# Patient Record
Sex: Male | Born: 1935 | Race: White | Hispanic: No | Smoking: Never smoker
Health system: Southern US, Community
[De-identification: ages and names within clinical notes are randomized; demographics above are authoritative.]

## PROBLEM LIST (undated history)

## (undated) DIAGNOSIS — R062 Wheezing: Secondary | ICD-10-CM

## (undated) DIAGNOSIS — G20A1 Parkinson's disease without dyskinesia, without mention of fluctuations: Secondary | ICD-10-CM

## (undated) DIAGNOSIS — G20 Parkinson's disease: Secondary | ICD-10-CM

## (undated) DIAGNOSIS — R339 Retention of urine, unspecified: Secondary | ICD-10-CM

## (undated) DIAGNOSIS — R279 Unspecified lack of coordination: Secondary | ICD-10-CM

## (undated) DIAGNOSIS — I10 Essential (primary) hypertension: Secondary | ICD-10-CM

## (undated) DIAGNOSIS — R1310 Dysphagia, unspecified: Secondary | ICD-10-CM

## (undated) DIAGNOSIS — N481 Balanitis: Secondary | ICD-10-CM

## (undated) DIAGNOSIS — F0390 Unspecified dementia without behavioral disturbance: Secondary | ICD-10-CM

## (undated) DIAGNOSIS — D631 Anemia in chronic kidney disease: Secondary | ICD-10-CM

## (undated) DIAGNOSIS — R269 Unspecified abnormalities of gait and mobility: Secondary | ICD-10-CM

## (undated) DIAGNOSIS — F329 Major depressive disorder, single episode, unspecified: Secondary | ICD-10-CM

## (undated) DIAGNOSIS — K5792 Diverticulitis of intestine, part unspecified, without perforation or abscess without bleeding: Secondary | ICD-10-CM

## (undated) DIAGNOSIS — N179 Acute kidney failure, unspecified: Secondary | ICD-10-CM

## (undated) DIAGNOSIS — R296 Repeated falls: Secondary | ICD-10-CM

## (undated) DIAGNOSIS — N189 Chronic kidney disease, unspecified: Secondary | ICD-10-CM

## (undated) DIAGNOSIS — M6281 Muscle weakness (generalized): Secondary | ICD-10-CM

## (undated) DIAGNOSIS — G4700 Insomnia, unspecified: Secondary | ICD-10-CM

## (undated) DIAGNOSIS — N390 Urinary tract infection, site not specified: Secondary | ICD-10-CM

## (undated) DIAGNOSIS — K219 Gastro-esophageal reflux disease without esophagitis: Secondary | ICD-10-CM

---

## 2016-05-27 ENCOUNTER — Observation Stay (HOSPITAL_COMMUNITY)
Admission: EM | Admit: 2016-05-27 | Discharge: 2016-05-28 | Disposition: A | Discharge status: Home / Self Care | Payer: Medicare Other | Attending: Internal Medicine | Admitting: Internal Medicine

## 2016-05-27 ENCOUNTER — Other Ambulatory Visit: Payer: Self-pay

## 2016-05-27 ENCOUNTER — Emergency Department (HOSPITAL_COMMUNITY): Payer: Medicare Other

## 2016-05-27 ENCOUNTER — Encounter (HOSPITAL_COMMUNITY): Payer: Self-pay | Admitting: Emergency Medicine

## 2016-05-27 DIAGNOSIS — Z79899 Other long term (current) drug therapy: Secondary | ICD-10-CM | POA: Insufficient documentation

## 2016-05-27 DIAGNOSIS — N390 Urinary tract infection, site not specified: Secondary | ICD-10-CM

## 2016-05-27 DIAGNOSIS — F0391 Unspecified dementia with behavioral disturbance: Secondary | ICD-10-CM | POA: Diagnosis not present

## 2016-05-27 DIAGNOSIS — G20 Parkinson's disease: Secondary | ICD-10-CM | POA: Insufficient documentation

## 2016-05-27 DIAGNOSIS — N3000 Acute cystitis without hematuria: Secondary | ICD-10-CM

## 2016-05-27 DIAGNOSIS — F0390 Unspecified dementia without behavioral disturbance: Secondary | ICD-10-CM | POA: Diagnosis present

## 2016-05-27 DIAGNOSIS — G9341 Metabolic encephalopathy: Secondary | ICD-10-CM | POA: Diagnosis present

## 2016-05-27 DIAGNOSIS — R4189 Other symptoms and signs involving cognitive functions and awareness: Secondary | ICD-10-CM

## 2016-05-27 DIAGNOSIS — R4182 Altered mental status, unspecified: Secondary | ICD-10-CM | POA: Diagnosis not present

## 2016-05-27 DIAGNOSIS — L8990 Pressure ulcer of unspecified site, unspecified stage: Secondary | ICD-10-CM | POA: Insufficient documentation

## 2016-05-27 DIAGNOSIS — I10 Essential (primary) hypertension: Secondary | ICD-10-CM | POA: Insufficient documentation

## 2016-05-27 DIAGNOSIS — G20A1 Parkinson's disease without dyskinesia, without mention of fluctuations: Secondary | ICD-10-CM | POA: Diagnosis present

## 2016-05-27 DIAGNOSIS — F03918 Unspecified dementia, unspecified severity, with other behavioral disturbance: Secondary | ICD-10-CM | POA: Diagnosis present

## 2016-05-27 HISTORY — DX: Unspecified dementia, unspecified severity, without behavioral disturbance, psychotic disturbance, mood disturbance, and anxiety: F03.90

## 2016-05-27 HISTORY — DX: Parkinson's disease without dyskinesia, without mention of fluctuations: G20.A1

## 2016-05-27 HISTORY — DX: Wheezing: R06.2

## 2016-05-27 HISTORY — DX: Repeated falls: R29.6

## 2016-05-27 HISTORY — DX: Muscle weakness (generalized): M62.81

## 2016-05-27 HISTORY — DX: Unspecified abnormalities of gait and mobility: R26.9

## 2016-05-27 HISTORY — DX: Acute kidney failure, unspecified: N17.9

## 2016-05-27 HISTORY — DX: Insomnia, unspecified: G47.00

## 2016-05-27 HISTORY — DX: Anemia in chronic kidney disease: D63.1

## 2016-05-27 HISTORY — DX: Balanitis: N48.1

## 2016-05-27 HISTORY — DX: Urinary tract infection, site not specified: N39.0

## 2016-05-27 HISTORY — DX: Essential (primary) hypertension: I10

## 2016-05-27 HISTORY — DX: Diverticulitis of intestine, part unspecified, without perforation or abscess without bleeding: K57.92

## 2016-05-27 HISTORY — DX: Retention of urine, unspecified: R33.9

## 2016-05-27 HISTORY — DX: Parkinson's disease: G20

## 2016-05-27 HISTORY — DX: Chronic kidney disease, unspecified: N18.9

## 2016-05-27 HISTORY — DX: Dysphagia, unspecified: R13.10

## 2016-05-27 HISTORY — DX: Unspecified lack of coordination: R27.9

## 2016-05-27 HISTORY — DX: Major depressive disorder, single episode, unspecified: F32.9

## 2016-05-27 HISTORY — DX: Gastro-esophageal reflux disease without esophagitis: K21.9

## 2016-05-27 LAB — URINE MICROSCOPIC-ADD ON: RBC / HPF: NONE SEEN RBC/hpf (ref 0–5)

## 2016-05-27 LAB — URINALYSIS, ROUTINE W REFLEX MICROSCOPIC
Bilirubin Urine: NEGATIVE
Glucose, UA: NEGATIVE mg/dL
Hgb urine dipstick: NEGATIVE
NITRITE: NEGATIVE
PH: 6 (ref 5.0–8.0)
PROTEIN: NEGATIVE mg/dL
Specific Gravity, Urine: 1.02 (ref 1.005–1.030)

## 2016-05-27 LAB — COMPREHENSIVE METABOLIC PANEL
ALBUMIN: 3.5 g/dL (ref 3.5–5.0)
ALK PHOS: 51 U/L (ref 38–126)
ALT: 8 U/L — AB (ref 17–63)
AST: 20 U/L (ref 15–41)
Anion gap: 5 (ref 5–15)
BILIRUBIN TOTAL: 0.5 mg/dL (ref 0.3–1.2)
BUN: 28 mg/dL — AB (ref 6–20)
CALCIUM: 8.7 mg/dL — AB (ref 8.9–10.3)
CO2: 27 mmol/L (ref 22–32)
Chloride: 107 mmol/L (ref 101–111)
Creatinine, Ser: 0.93 mg/dL (ref 0.61–1.24)
GFR calc Af Amer: >60 mL/min (ref >60)
GFR calc non Af Amer: >60 mL/min (ref >60)
GLUCOSE: 92 mg/dL (ref 65–99)
POTASSIUM: 3.8 mmol/L (ref 3.5–5.1)
Sodium: 139 mmol/L (ref 135–145)
TOTAL PROTEIN: 6.5 g/dL (ref 6.5–8.1)

## 2016-05-27 LAB — CBC WITH DIFFERENTIAL/PLATELET
BASOS ABS: 0 10*3/uL (ref 0.0–0.1)
BASOS PCT: 0 %
Eosinophils Absolute: 0.2 10*3/uL (ref 0.0–0.7)
Eosinophils Relative: 2 %
HEMATOCRIT: 35 % — AB (ref 39.0–52.0)
HEMOGLOBIN: 11.6 g/dL — AB (ref 13.0–17.0)
Lymphocytes Relative: 23 %
Lymphs Abs: 1.9 10*3/uL (ref 0.7–4.0)
MCH: 30.6 pg (ref 26.0–34.0)
MCHC: 33.1 g/dL (ref 30.0–36.0)
MCV: 92.3 fL (ref 78.0–100.0)
Monocytes Absolute: 0.8 10*3/uL (ref 0.1–1.0)
Monocytes Relative: 10 %
NEUTROS ABS: 5.6 10*3/uL (ref 1.7–7.7)
NEUTROS PCT: 65 %
Platelets: 160 10*3/uL (ref 150–400)
RBC: 3.79 MIL/uL — AB (ref 4.22–5.81)
RDW: 13.3 % (ref 11.5–15.5)
WBC: 8.5 10*3/uL (ref 4.0–10.5)

## 2016-05-27 LAB — I-STAT CG4 LACTIC ACID, ED: Lactic Acid, Venous: 0.73 mmol/L (ref 0.5–2.0)

## 2016-05-27 LAB — TROPONIN I: Troponin I: <0.03 ng/mL (ref <0.031)

## 2016-05-27 MED ORDER — POLYETHYLENE GLYCOL 3350 17 G PO PACK
17.0000 g | PACK | Freq: Every day | ORAL | Status: DC
  Administered 2016-05-28: 17 g via ORAL
  Filled 2016-05-27: qty 1

## 2016-05-27 MED ORDER — IPRATROPIUM-ALBUTEROL 0.5-2.5 (3) MG/3ML IN SOLN: 3.0000 mL | Freq: Four times a day (QID) | RESPIRATORY_TRACT | Status: DC | PRN

## 2016-05-27 MED ORDER — VITAMIN D 1000 UNITS PO TABS
1000.0000 [IU] | ORAL_TABLET | Freq: Every day | ORAL | Status: DC
  Filled 2016-05-27: qty 1

## 2016-05-27 MED ORDER — HALOPERIDOL LACTATE 5 MG/ML IJ SOLN: 5.0000 mg | Freq: Four times a day (QID) | INTRAMUSCULAR | Status: DC | PRN

## 2016-05-27 MED ORDER — MOMETASONE FURO-FORMOTEROL FUM 200-5 MCG/ACT IN AERO
2.0000 | INHALATION_SPRAY | Freq: Two times a day (BID) | RESPIRATORY_TRACT | Status: DC
  Filled 2016-05-27: qty 8.8

## 2016-05-27 MED ORDER — CEFTRIAXONE SODIUM 1 G IJ SOLR
1.0000 g | Freq: Once | INTRAMUSCULAR | Status: AC
  Administered 2016-05-27: 1 g via INTRAVENOUS
  Filled 2016-05-27: qty 10

## 2016-05-27 MED ORDER — SERTRALINE HCL 50 MG PO TABS
100.0000 mg | ORAL_TABLET | Freq: Every day | ORAL | Status: DC
  Administered 2016-05-28: 100 mg via ORAL
  Filled 2016-05-27: qty 2

## 2016-05-27 MED ORDER — ASPIRIN EC 81 MG PO TBEC
81.0000 mg | DELAYED_RELEASE_TABLET | Freq: Every day | ORAL | Status: DC
  Administered 2016-05-27 – 2016-05-28 (×2): 81 mg via ORAL
  Filled 2016-05-27 (×2): qty 1

## 2016-05-27 MED ORDER — HALOPERIDOL LACTATE 5 MG/ML IJ SOLN
INTRAMUSCULAR | Status: AC
  Filled 2016-05-27: qty 1

## 2016-05-27 MED ORDER — ONDANSETRON HCL 4 MG/2ML IJ SOLN: 4.0000 mg | Freq: Four times a day (QID) | INTRAMUSCULAR | Status: DC | PRN

## 2016-05-27 MED ORDER — ACETAMINOPHEN 650 MG RE SUPP: 650.0000 mg | Freq: Four times a day (QID) | RECTAL | Status: DC | PRN

## 2016-05-27 MED ORDER — CARBIDOPA-LEVODOPA-ENTACAPONE 50-200-200 MG PO TABS
1.0000 | ORAL_TABLET | Freq: Three times a day (TID) | ORAL | Status: DC
  Filled 2016-05-27: qty 1

## 2016-05-27 MED ORDER — LOPERAMIDE HCL 2 MG PO CAPS: 2.0000 mg | ORAL_CAPSULE | ORAL | Status: DC | PRN

## 2016-05-27 MED ORDER — HALOPERIDOL LACTATE 2 MG/ML PO CONC
1.0000 mg | ORAL | Status: DC | PRN
  Filled 2016-05-27: qty 0.5

## 2016-05-27 MED ORDER — POLYETHYLENE GLYCOL 3350 17 G PO PACK: 17.0000 g | PACK | Freq: Every day | ORAL | Status: DC | PRN

## 2016-05-27 MED ORDER — MIRTAZAPINE 15 MG PO TABS
15.0000 mg | ORAL_TABLET | Freq: Every day | ORAL | Status: DC
  Administered 2016-05-27: 15 mg via ORAL
  Filled 2016-05-27: qty 1

## 2016-05-27 MED ORDER — ALBUTEROL SULFATE (2.5 MG/3ML) 0.083% IN NEBU: 2.5000 mg | INHALATION_SOLUTION | RESPIRATORY_TRACT | Status: DC | PRN

## 2016-05-27 MED ORDER — HALOPERIDOL 1 MG PO TABS: 1.0000 mg | ORAL_TABLET | Freq: Once | ORAL | Status: DC

## 2016-05-27 MED ORDER — SODIUM CHLORIDE 0.9 % IV SOLN
INTRAVENOUS | Status: AC
  Administered 2016-05-27: 22:00:00 via INTRAVENOUS

## 2016-05-27 MED ORDER — ATENOLOL 25 MG PO TABS
12.5000 mg | ORAL_TABLET | Freq: Every day | ORAL | Status: DC
  Administered 2016-05-28: 12.5 mg via ORAL
  Filled 2016-05-27: qty 1

## 2016-05-27 MED ORDER — HALOPERIDOL LACTATE 5 MG/ML IJ SOLN
2.5000 mg | Freq: Once | INTRAMUSCULAR | Status: AC
  Administered 2016-05-27: 2.5 mg via INTRAVENOUS

## 2016-05-27 MED ORDER — ONDANSETRON HCL 4 MG PO TABS: 4.0000 mg | ORAL_TABLET | Freq: Four times a day (QID) | ORAL | Status: DC | PRN

## 2016-05-27 MED ORDER — FERROUS SULFATE 325 (65 FE) MG PO TABS
325.0000 mg | ORAL_TABLET | Freq: Every day | ORAL | Status: DC
  Administered 2016-05-28: 325 mg via ORAL
  Filled 2016-05-27: qty 1

## 2016-05-27 MED ORDER — SODIUM CHLORIDE 0.9% FLUSH
3.0000 mL | Freq: Two times a day (BID) | INTRAVENOUS | Status: DC
  Administered 2016-05-27: 3 mL via INTRAVENOUS

## 2016-05-27 MED ORDER — DEXTROSE 5 % IV SOLN: 1.0000 g | INTRAVENOUS | Status: DC

## 2016-05-27 MED ORDER — SODIUM CHLORIDE 0.9% FLUSH: 3.0000 mL | INTRAVENOUS | Status: DC | PRN

## 2016-05-27 MED ORDER — VITAMIN B-12 1000 MCG PO TABS
1000.0000 ug | ORAL_TABLET | Freq: Every day | ORAL | Status: DC
  Administered 2016-05-28: 1000 ug via ORAL
  Filled 2016-05-27: qty 1

## 2016-05-27 MED ORDER — DEXTROSE 5 % IV SOLN
1.0000 g | INTRAVENOUS | Status: DC
  Filled 2016-05-27: qty 10

## 2016-05-27 MED ORDER — SENNA 8.6 MG PO TABS
1.0000 | ORAL_TABLET | Freq: Two times a day (BID) | ORAL | Status: DC
  Administered 2016-05-27 – 2016-05-28 (×2): 8.6 mg via ORAL
  Filled 2016-05-27 (×2): qty 1

## 2016-05-27 MED ORDER — ACETAMINOPHEN 325 MG PO TABS: 650.0000 mg | ORAL_TABLET | Freq: Four times a day (QID) | ORAL | Status: DC | PRN

## 2016-05-27 MED ORDER — IPRATROPIUM-ALBUTEROL 18-103 MCG/ACT IN AERO: 1.0000 | INHALATION_SPRAY | Freq: Four times a day (QID) | RESPIRATORY_TRACT | Status: DC | PRN

## 2016-05-27 MED ORDER — TRAZODONE HCL 50 MG PO TABS
150.0000 mg | ORAL_TABLET | Freq: Every day | ORAL | Status: DC
  Administered 2016-05-27: 150 mg via ORAL
  Filled 2016-05-27: qty 3

## 2016-05-27 MED ORDER — MELATONIN 5 MG PO TABS: 10.0000 mg | ORAL_TABLET | Freq: Every day | ORAL | Status: DC

## 2016-05-27 MED ORDER — SODIUM CHLORIDE 0.9 % IV SOLN: 250.0000 mL | INTRAVENOUS | Status: DC | PRN

## 2016-05-27 MED ORDER — PANTOPRAZOLE SODIUM 40 MG PO TBEC
40.0000 mg | DELAYED_RELEASE_TABLET | Freq: Every day | ORAL | Status: DC
  Administered 2016-05-28: 40 mg via ORAL
  Filled 2016-05-27: qty 1

## 2016-05-27 MED ORDER — TRAZODONE HCL 50 MG PO TABS: 50.0000 mg | ORAL_TABLET | Freq: Every evening | ORAL | Status: DC | PRN

## 2016-05-27 MED ORDER — SODIUM CHLORIDE 0.9 % IV BOLUS (SEPSIS)
1000.0000 mL | Freq: Once | INTRAVENOUS | Status: AC
  Administered 2016-05-27: 1000 mL via INTRAVENOUS

## 2016-05-27 MED ORDER — HEPARIN SODIUM (PORCINE) 5000 UNIT/ML IJ SOLN
5000.0000 [IU] | Freq: Three times a day (TID) | INTRAMUSCULAR | Status: DC
  Administered 2016-05-27 – 2016-05-28 (×2): 5000 [IU] via SUBCUTANEOUS
  Filled 2016-05-27 (×2): qty 1

## 2016-05-27 NOTE — ED Notes (Signed)
Patient from [facility] in [place]. 1 Episode of fever today at 100.8 orally. Given tylenol 650 mg at 1200. Alert. C/o urinary frequency. EMS reports dry cough.

## 2016-05-27 NOTE — ED Notes (Signed)
Pt continues to not allow nurse to perform tasks for pt, pt is swatting at nursing staff and cursing.

## 2016-05-27 NOTE — ED Notes (Addendum)
Unable to attach IV tubing at this time. Pt swatting and trying to pull at IV.

## 2016-05-27 NOTE — ED Notes (Signed)
Pt increasingly confused trying to pinch nurse while doing tasks for pt.  Attempted with nurse tech to bladder scan pt, pt is kicking and swatting with arms not allowing us to scan bladder.  Dr. Manus Gunningancour notified and plan is to attempt to scan pt in the next 30 minutes.

## 2016-05-27 NOTE — H&P (Signed)
Patient Demographics:    Richard Conley, is a 82 y.o. male  MRN: [id number]   DOB - 1934/04/27  Admit Date - 05/27/2016  Outpatient Primary MD for the patient is No primary care provider on file.   Assessment & Plan:    Principal Problem:   Encephalopathy, metabolic Active Problems:   Urinary tract infection   Dementia with behavioral disturbance   Parkinson disease (HCC)    1)Uncomplicated UTI- UA and urine microscopy strongly suggestive of UTI, patient has a temp of 100.8,, no abdominal or flank pain no vomiting or diarrhea, no leukocytosis no tachycardia or tachypnea. Treat empirically with IV Rocephin pending urine and blood cultures lactic acid is less than 1. Patient does not meet sepsis criteria  encephalopathy-most likely secondary to #1 above, patient's daughter is concerned about possible right-sided weakness, she would like MRI Of the brain to rule out acute stroke,CT head shows old right-sided strokes without any new acute findings.  Patient has old left-sided weakness from previous stroke  3) Parkinson's disease- c/n [facility]  4)Dementia with behavioral disturbance- ??? Lewy body type given history of Parkinson's, treat empirically with Remeron 15 mg daily at bedtime, Zoloft 100 mg melatonin, trazodone 150 mg daily, use haloperidol when necessary agitation  5)PreRenal azotemia-BUN/creatinine ratio over 30, suspect secondary to dehydration/poor intake, hydrate IV and by mouth, hold lisinopril onto patient is euvolemic  6)HTN-restart atenolol, but hold lisinopril onto patient is euvolemic  7)Disposition- Likely DC to  Back to SNF on omnicef in am if MRI brain is negative for new acute stroke. Patient is likely to do better in a more familiar setting of his current nursing home, his UTI is  uncomplicated and without sepsis syndrome. Patient is more likely to be confused/agitated with increased risk of fall and Self injury in hospital due to unfamiliar environment and unfamiliar staff.   With History of - Reviewed by me  Past Medical History  Diagnosis Date  . Parkinson disease (HCC)   . Dementia   . Muscle weakness   . Abnormality of gait and mobility   . Lack of coordination   . Dysphagia   . Anemia in chronic kidney disease   . MDD (major depressive disorder) (HCC)   . Hypertension   . GERD (gastroesophageal reflux disease)   . Insomnia   . Wheezing   . Repeated falls   . Urinary retention   . UTI (lower urinary tract infection)   . Balanitis   . Diverticulitis   . Acute kidney failure (HCC)       History reviewed. No pertinent past surgical history.    Chief Complaint  Patient presents with  . Fever      HPI:    Richard Conley  is a 82 y.o. male, With past medical history relevant for dementia with behavioral disturbance, Parkinson's disease, hypertension who presents from skilled nursing facility with increased confusion, temperature of 100.8  and lethargy. History is very limited due to advanced dementia, in ED no abdominal or flank pain no vomiting or diarrhea, no leukocytosis no tachycardia or tachypnea. UA and urine microscopy strongly suggestive of UTI, patient's daughter is concerned about new right-sided weakness and possible stroke. Patient has old left-sided weakness from previous stroke    Review of systems:    In addition to the HPI above,   A full 12 point Review of Systems was done, except as stated above, all other Review of Systems were negative.    Social History:  Reviewed by me    Social History  Substance Use Topics  . Smoking status: Unknown If Ever Smoked  . Smokeless tobacco: Not on file  . Alcohol Use: No       Family History :  Reviewed by me   No family history on file.    Home Medications:   Prior to  Admission medications   Medication Sig Start Date End Date Taking? Authorizing Provider  acetaminophen (TYLENOL) 325 MG tablet Take 650 mg by mouth every 6 (six) hours.   Yes Historical Provider, MD  albuterol-ipratropium (COMBIVENT) 18-103 MCG/ACT inhaler Inhale 1 puff into the lungs every 6 (six) hours as needed for wheezing or shortness of breath.   Yes Historical Provider, MD  atenolol (TENORMIN) 25 MG tablet Take 12.5 mg by mouth daily.   Yes Historical Provider, MD  budesonide-formoterol (SYMBICORT) 160-4.5 MCG/ACT inhaler Inhale 2 puffs into the lungs 2 (two) times daily.   Yes Historical Provider, MD  carbidopa-levodopa-entacapone (STALEVO) 50-200-200 MG tablet Take 1 tablet by mouth 3 (three) times daily.   Yes Historical Provider, MD  cholecalciferol (VITAMIN D) 1000 units tablet Take 1,000 Units by mouth daily.   Yes Historical Provider, MD  ferrous sulfate 325 (65 FE) MG tablet Take 325 mg by mouth daily.   Yes Historical Provider, MD  HALOPERIDOL LACTATE PO Take 1 mg by mouth as needed (for behavior related to dementia).   Yes Historical Provider, MD  lisinopril (PRINIVIL,ZESTRIL) 5 MG tablet Take 5 mg by mouth daily.   Yes Historical Provider, MD  loperamide (IMODIUM A-D) 2 MG tablet Take 2 mg by mouth as needed for diarrhea or loose stools.   Yes Historical Provider, MD  Melatonin 5 MG TABS Take 10 mg by mouth at bedtime.   Yes Historical Provider, MD  mirtazapine (REMERON) 15 MG tablet Take 15 mg by mouth at bedtime.   Yes Historical Provider, MD  omeprazole (PRILOSEC) 20 MG capsule Take 20 mg by mouth daily.   Yes Historical Provider, MD  polyethylene glycol powder (GLYCOLAX/MIRALAX) powder Take 17 g by mouth daily.   Yes Historical Provider, MD  sertraline (ZOLOFT) 100 MG tablet Take 100 mg by mouth daily.   Yes Historical Provider, MD  traZODone (DESYREL) 150 MG tablet Take 150 mg by mouth at bedtime.   Yes Historical Provider, MD  vitamin B-12 (CYANOCOBALAMIN) 1000 MCG tablet  Take 1,000 mcg by mouth daily.   Yes Historical Provider, MD     Allergies:     Allergies  Allergen Reactions  . Ativan [Lorazepam]   . Zyprexa [Olanzapine]      Physical Exam:   Vitals  Blood pressure 179/128, pulse 78, temperature 99.2 F (37.3 C), temperature source Rectal, resp. rate 20, weight 74.844 kg (165 lb), SpO2 92 %.  Physical Examination: General appearance  - in no distress and  Mental status - Awake and pleasantly confused Eyes - sclera anicteric Neck -  supple, no JVD elevation , Chest - clear  to auscultation bilaterally, symmetrical air movement, Heart - S1 and S2 normal,  Abdomen - soft, nontender, nondistended, no masses or organomegaly, no CVA area tenderness Neurological - parkinsonian findings, mild left hemiparesis (not new), neuro exam is limited due to poor cooperation from patient Extremities - no pedal edema noted, intact peripheral pulses  Skin - warm, dry    Data Review:    CBC  Recent Labs Lab 05/27/16 1545  WBC 8.5  HGB 11.6*  HCT 35.0*  PLT 160  MCV 92.3  MCH 30.6  MCHC 33.1  RDW 13.3  LYMPHSABS 1.9  MONOABS 0.8  EOSABS 0.2  BASOSABS 0.0   ------------------------------------------------------------------------------------------------------------------  Chemistries   Recent Labs Lab 05/27/16 1545  NA 139  K 3.8  CL 107  CO2 27  GLUCOSE 92  BUN 28*  CREATININE 0.93  CALCIUM 8.7*  AST 20  ALT 8*  ALKPHOS 51  BILITOT 0.5   ------------------------------------------------------------------------------------------------------------------ CrCl cannot be calculated (Unknown ideal weight.). ------------------------------------------------------------------------------------------------------------------ No results for input(s): TSH, T4TOTAL, T3FREE, THYROIDAB in the last 72 hours.  Invalid input(s): FREET3   Coagulation profile No results for input(s): INR, PROTIME in the last 168  hours. ------------------------------------------------------------------------------------------------------------------- No results for input(s): DDIMER in the last 72 hours. -------------------------------------------------------------------------------------------------------------------  Cardiac Enzymes  Recent Labs Lab 05/27/16 1545  TROPONINI <0.03   ------------------------------------------------------------------------------------------------------------------ No results found for: BNP   ---------------------------------------------------------------------------------------------------------------  Urinalysis    Component Value Date/Time   COLORURINE YELLOW 05/27/2016 1500   APPEARANCEUR CLEAR 05/27/2016 1500   LABSPEC 1.020 05/27/2016 1500   PHURINE 6.0 05/27/2016 1500   GLUCOSEU NEGATIVE 05/27/2016 1500   HGBUR NEGATIVE 05/27/2016 1500   BILIRUBINUR NEGATIVE 05/27/2016 1500   KETONESUR TRACE* 05/27/2016 1500   PROTEINUR NEGATIVE 05/27/2016 1500   NITRITE NEGATIVE 05/27/2016 1500   LEUKOCYTESUR MODERATE* 05/27/2016 1500    ----------------------------------------------------------------------------------------------------------------   Imaging Results:    Dg Chest 2 View  05/27/2016  CLINICAL DATA:  Cough today.  Initial encounter. EXAM: CHEST  2 VIEW COMPARISON:  None. FINDINGS: Lung volumes are somewhat low but the lungs are clear. Heart size is normal. No pneumothorax or pleural effusion. No focal bony abnormality. Degenerative change about the shoulders noted. IMPRESSION: No acute disease. Electronically Signed   By: Drusilla Kanner M.D.   On: 05/27/2016 15:24   Ct Head Wo Contrast  05/27/2016  CLINICAL DATA:  Fever and left-sided weakness. Parkinson disease. Dementia. EXAM: CT HEAD WITHOUT CONTRAST TECHNIQUE: Contiguous axial images were obtained from the base of the skull through the vertex without intravenous contrast. COMPARISON:  None. FINDINGS: 2.1 by  0.7 cm focus of encephalomalacia involving the right caudate head, anterior limb right internal capsule, and periventricular white matter, images 19-22 series 2. Faint 3 mm hypodensity in the right thalamus, image 18/2. Likely a remote lacunar infarct. Otherwise, the brainstem, cerebellum, cerebral peduncles, thalami, basal ganglia, basilar cisterns, and ventricular system appear within normal limits. No intracranial hemorrhage, mass lesion, or acute CVA. Chronic bilateral maxillary and ethmoid sinusitis with a small amount of frothy material in the left sphenoid sinus. IMPRESSION: 1. Remote infarct of the right caudate head, anterior limb right internal capsule, and adjacent periventricular white matter. 2. Small remote lacunar infarct in the right thalamus. 3. No acute intracranial findings. 4. Mild chronic paranasal sinusitis. Electronically Signed   By: Gaylyn Rong M.D.   On: 05/27/2016 15:44    Radiological Exams on Admission: Dg Chest 2 View  05/27/2016  CLINICAL  DATA:  Cough today.  Initial encounter. EXAM: CHEST  2 VIEW COMPARISON:  None. FINDINGS: Lung volumes are somewhat low but the lungs are clear. Heart size is normal. No pneumothorax or pleural effusion. No focal bony abnormality. Degenerative change about the shoulders noted. IMPRESSION: No acute disease. Electronically Signed   By: Drusilla Kannerhomas  Dalessio M.D.   On: 05/27/2016 15:24   Ct Head Wo Contrast  05/27/2016  CLINICAL DATA:  Fever and left-sided weakness. Parkinson disease. Dementia. EXAM: CT HEAD WITHOUT CONTRAST TECHNIQUE: Contiguous axial images were obtained from the base of the skull through the vertex without intravenous contrast. COMPARISON:  None. FINDINGS: 2.1 by 0.7 cm focus of encephalomalacia involving the right caudate head, anterior limb right internal capsule, and periventricular white matter, images 19-22 series 2. Faint 3 mm hypodensity in the right thalamus, image 18/2. Likely a remote lacunar infarct. Otherwise, the  brainstem, cerebellum, cerebral peduncles, thalami, basal ganglia, basilar cisterns, and ventricular system appear within normal limits. No intracranial hemorrhage, mass lesion, or acute CVA. Chronic bilateral maxillary and ethmoid sinusitis with a small amount of frothy material in the left sphenoid sinus. IMPRESSION: 1. Remote infarct of the right caudate head, anterior limb right internal capsule, and adjacent periventricular white matter. 2. Small remote lacunar infarct in the right thalamus. 3. No acute intracranial findings. 4. Mild chronic paranasal sinusitis. Electronically Signed   By: Gaylyn RongWalter  Liebkemann M.D.   On: 05/27/2016 15:44    DVT Prophylaxis heparin sq  AM Labs Ordered, also please review Full Orders  Code Status - Full Code  Likely DC to  Back to SNF on omnicef in am if MRI brain is negative for new acute stroke  Condition   Stable,   Braxon Suder M.D on 05/27/2016 at 7:59 PM   Between 7am to 7pm - Pager - [phone]  After 7pm go to [email or web address] - password [email or web address]  [facility] - Office  [phone]  Dragon dictation system was used to create this note, attempts have been made to correct errors, however presence of uncorrected errors is not a reflection quality of care provided.

## 2016-05-27 NOTE — ED Notes (Signed)
Pt unable to sit still for an accurate bp, pt not allowing nurse to put pulse ox on finger.

## 2016-05-27 NOTE — ED Notes (Signed)
Spoke to Du PontPenny williamson, daughter [phone]( [phone]) about pt, pt also asked to speak with Dr. Manus Gunningancour.  Dr. Manus Gunningancour on phone with pt daughter at this time.

## 2016-05-27 NOTE — ED Provider Notes (Signed)
CSN: [id number]     Arrival date & time 05/27/16  1438 History   First MD Initiated Contact with Patient 05/27/16 1500     Chief Complaint  Patient presents with  . Fever     (Consider location/radiation/quality/duration/timing/severity/associated sxs/prior Treatment) HPI Comments: Level 5 caveat for dementia. Patient brought by EMS with 1 episode of fever to 100.8 as well as decreased responsiveness over the past day. No family available. Nursing home reports that patient "head was down more than usual". They do not know much about his past medical history. He does have a history of Parkinson's disease and dementia listed. Patient oriented 1. Unable to give a meaningful history.  The history is provided by the nursing home, the EMS personnel and the patient. The history is limited by the condition of the patient.    Past Medical History  Diagnosis Date  . Parkinson disease (HCC)   . Dementia   . Muscle weakness   . Abnormality of gait and mobility   . Lack of coordination   . Dysphagia   . Anemia in chronic kidney disease   . MDD (major depressive disorder) (HCC)   . Hypertension   . GERD (gastroesophageal reflux disease)   . Insomnia   . Wheezing   . Repeated falls   . Urinary retention   . UTI (lower urinary tract infection)   . Balanitis   . Diverticulitis   . Acute kidney failure (HCC)    History reviewed. No pertinent past surgical history. No family history on file. Social History  Substance Use Topics  . Smoking status: Unknown If Ever Smoked  . Smokeless tobacco: None  . Alcohol Use: No    Review of Systems  Unable to perform ROS: Dementia  Constitutional: Positive for fever.      Allergies  Ativan and Zyprexa  Home Medications   Prior to Admission medications   Medication Sig Start Date End Date Taking? Authorizing Provider  acetaminophen (TYLENOL) 325 MG tablet Take 650 mg by mouth every 6 (six) hours.   Yes Historical Provider, MD   albuterol-ipratropium (COMBIVENT) 18-103 MCG/ACT inhaler Inhale 1 puff into the lungs every 6 (six) hours as needed for wheezing or shortness of breath.   Yes Historical Provider, MD  atenolol (TENORMIN) 25 MG tablet Take 12.5 mg by mouth daily.   Yes Historical Provider, MD  budesonide-formoterol (SYMBICORT) 160-4.5 MCG/ACT inhaler Inhale 2 puffs into the lungs 2 (two) times daily.   Yes Historical Provider, MD  carbidopa-levodopa-entacapone (STALEVO) 50-200-200 MG tablet Take 1 tablet by mouth 3 (three) times daily.   Yes Historical Provider, MD  cholecalciferol (VITAMIN D) 1000 units tablet Take 1,000 Units by mouth daily.   Yes Historical Provider, MD  ferrous sulfate 325 (65 FE) MG tablet Take 325 mg by mouth daily.   Yes Historical Provider, MD  HALOPERIDOL LACTATE PO Take 1 mg by mouth as needed (for behavior related to dementia).   Yes Historical Provider, MD  lisinopril (PRINIVIL,ZESTRIL) 5 MG tablet Take 5 mg by mouth daily.   Yes Historical Provider, MD  loperamide (IMODIUM A-D) 2 MG tablet Take 2 mg by mouth as needed for diarrhea or loose stools.   Yes Historical Provider, MD  Melatonin 5 MG TABS Take 10 mg by mouth at bedtime.   Yes Historical Provider, MD  mirtazapine (REMERON) 15 MG tablet Take 15 mg by mouth at bedtime.   Yes Historical Provider, MD  omeprazole (PRILOSEC) 20 MG capsule Take 20 mg by  mouth daily.   Yes Historical Provider, MD  polyethylene glycol powder (GLYCOLAX/MIRALAX) powder Take 17 g by mouth daily.   Yes Historical Provider, MD  sertraline (ZOLOFT) 100 MG tablet Take 100 mg by mouth daily.   Yes Historical Provider, MD  traZODone (DESYREL) 150 MG tablet Take 150 mg by mouth at bedtime.   Yes Historical Provider, MD  vitamin B-12 (CYANOCOBALAMIN) 1000 MCG tablet Take 1,000 mcg by mouth daily.   Yes Historical Provider, MD   BP 121/69 mmHg  Pulse 71  Temp(Src) 99.2 F (37.3 C) (Rectal)  Resp 18  Wt 165 lb (74.844 kg)  SpO2 97% Physical Exam   Constitutional: He is oriented to person, place, and time. He appears well-developed and well-nourished. No distress.  Oriented x1  HENT:  Head: Normocephalic and atraumatic.  Mouth/Throat: Oropharynx is clear and moist. No oropharyngeal exudate.  Eyes: Conjunctivae and EOM are normal. Pupils are equal, round, and reactive to light.  Neck: Normal range of motion. Neck supple.  No meningismus.  Cardiovascular: Normal rate, regular rhythm, normal heart sounds and intact distal pulses.   No murmur heard. Pulmonary/Chest: Effort normal and breath sounds normal. No respiratory distress. He exhibits no tenderness.  Abdominal: Soft. There is no tenderness. There is no rebound and no guarding.  Musculoskeletal: Normal range of motion. He exhibits no edema or tenderness.  Neurological: He is alert and oriented to person, place, and time. No cranial nerve deficit. He exhibits normal muscle tone. Coordination normal.  Oriented x1. Follow commands, moving all extremities. Weaker on L side, contracture of L thumb,  Skin: Skin is warm.  Psychiatric: He has a normal mood and affect. His behavior is normal.  Nursing note and vitals reviewed.   ED Course  Procedures (including critical care time) Labs Review Labs Reviewed  URINALYSIS, ROUTINE W REFLEX MICROSCOPIC (NOT [facility]) - Abnormal; Notable for the following:    Ketones, ur TRACE (*)    Leukocytes, UA MODERATE (*)    All other components within normal limits  CBC WITH DIFFERENTIAL/PLATELET - Abnormal; Notable for the following:    RBC 3.79 (*)    Hemoglobin 11.6 (*)    HCT 35.0 (*)    All other components within normal limits  COMPREHENSIVE METABOLIC PANEL - Abnormal; Notable for the following:    BUN 28 (*)    Calcium 8.7 (*)    ALT 8 (*)    All other components within normal limits  URINE MICROSCOPIC-ADD ON - Abnormal; Notable for the following:    Squamous Epithelial / LPF 0-5 (*)    Bacteria, UA MANY (*)    All other components  within normal limits  CULTURE, BLOOD (ROUTINE X 2)  CULTURE, BLOOD (ROUTINE X 2)  URINE CULTURE  TROPONIN I  I-STAT CG4 LACTIC ACID, ED    Imaging Review Dg Chest 2 View  05/27/2016  CLINICAL DATA:  Cough today.  Initial encounter. EXAM: CHEST  2 VIEW COMPARISON:  None. FINDINGS: Lung volumes are somewhat low but the lungs are clear. Heart size is normal. No pneumothorax or pleural effusion. No focal bony abnormality. Degenerative change about the shoulders noted. IMPRESSION: No acute disease. Electronically Signed   By: Drusilla Kannerhomas  Dalessio M.D.   On: 05/27/2016 15:24   Ct Head Wo Contrast  05/27/2016  CLINICAL DATA:  Fever and left-sided weakness. Parkinson disease. Dementia. EXAM: CT HEAD WITHOUT CONTRAST TECHNIQUE: Contiguous axial images were obtained from the base of the skull through the vertex without intravenous contrast.  COMPARISON:  None. FINDINGS: 2.1 by 0.7 cm focus of encephalomalacia involving the right caudate head, anterior limb right internal capsule, and periventricular white matter, images 19-22 series 2. Faint 3 mm hypodensity in the right thalamus, image 18/2. Likely a remote lacunar infarct. Otherwise, the brainstem, cerebellum, cerebral peduncles, thalami, basal ganglia, basilar cisterns, and ventricular system appear within normal limits. No intracranial hemorrhage, mass lesion, or acute CVA. Chronic bilateral maxillary and ethmoid sinusitis with a small amount of frothy material in the left sphenoid sinus. IMPRESSION: 1. Remote infarct of the right caudate head, anterior limb right internal capsule, and adjacent periventricular white matter. 2. Small remote lacunar infarct in the right thalamus. 3. No acute intracranial findings. 4. Mild chronic paranasal sinusitis. Electronically Signed   By: Gaylyn Rong M.D.   On: 05/27/2016 15:44   I have personally reviewed and evaluated these images and lab results as part of my medical decision-making.   EKG  Interpretation   Date/Time:  Tuesday May 27 2016 16:02:08 EDT Ventricular Rate:  71 PR Interval:    QRS Duration: 94 QT Interval:  389 QTC Calculation: 423 R Axis:   43 Text Interpretation:  Sinus rhythm Consider left atrial enlargement No  previous ECGs available Confirmed by Manus Gunning  MD, Nolberto Cheuvront [phone]) on  05/27/2016 4:06:23 PM      MDM   Final diagnoses:  Urinary tract infection without hematuria, site unspecified  Altered mental status, unspecified altered mental status type   From nursing home with fever and decreased mental status over the past day. Called caregiver nursing home who was unable to give a meaningful history. She states the patient's "head was done more than usual".  CT head shows chronic infarcts. Chest x-rays negative. Urinalysis positive for infection. Blood and urine cultures sent. Lactate is normal.  Patient confused at baseline. He appears to have no back pain or abdominal pain. Suspect uncomplicated urinary tract infection. IV Rocephin given in culture sent.  Patient does not appear to be toxic or septic.  Discussed with hospitalist Dr. Mariea Clonts. He feels patient would benefit from return to his facility as he can receive antibiotics PO.  Discussed the patient's daughter Boyd Kerbs. She saw patient today for the first time in 1 week.  she states that he haddifficulty moving his right side and was leaning to the right. Last seen normal one week ago.  CT head today shows old R sided infarcts.  Dr. Mariea Clonts agreeable to observation admission for IV antibiotics and possible MRI to r/o new stroke.  Glynn Octave, MD 05/28/16 [phone]

## 2016-05-27 NOTE — ED Notes (Signed)
Pt incontinent of urine, bed linens changed and new diaper applied to pt, pt is continuing to [employer] nursing staff and is cursing at nurse tech callilng her a "bitch".

## 2016-05-28 ENCOUNTER — Observation Stay (HOSPITAL_COMMUNITY): Payer: Medicare Other

## 2016-05-28 DIAGNOSIS — G9341 Metabolic encephalopathy: Secondary | ICD-10-CM | POA: Diagnosis not present

## 2016-05-28 DIAGNOSIS — R4182 Altered mental status, unspecified: Secondary | ICD-10-CM | POA: Diagnosis not present

## 2016-05-28 DIAGNOSIS — N3000 Acute cystitis without hematuria: Secondary | ICD-10-CM | POA: Diagnosis not present

## 2016-05-28 DIAGNOSIS — F0391 Unspecified dementia with behavioral disturbance: Secondary | ICD-10-CM | POA: Diagnosis not present

## 2016-05-28 LAB — BASIC METABOLIC PANEL
Anion gap: 4 — ABNORMAL LOW (ref 5–15)
BUN: 22 mg/dL — ABNORMAL HIGH (ref 6–20)
CALCIUM: 8.2 mg/dL — AB (ref 8.9–10.3)
CO2: 26 mmol/L (ref 22–32)
CREATININE: 0.94 mg/dL (ref 0.61–1.24)
Chloride: 110 mmol/L (ref 101–111)
Glucose, Bld: 86 mg/dL (ref 65–99)
Potassium: 3.7 mmol/L (ref 3.5–5.1)
SODIUM: 140 mmol/L (ref 135–145)

## 2016-05-28 LAB — CBC
HCT: 35.2 % — ABNORMAL LOW (ref 39.0–52.0)
Hemoglobin: 11.8 g/dL — ABNORMAL LOW (ref 13.0–17.0)
MCH: 30.8 pg (ref 26.0–34.0)
MCHC: 33.5 g/dL (ref 30.0–36.0)
MCV: 91.9 fL (ref 78.0–100.0)
PLATELETS: 139 10*3/uL — AB (ref 150–400)
RBC: 3.83 MIL/uL — AB (ref 4.22–5.81)
RDW: 13.2 % (ref 11.5–15.5)
WBC: 7.7 10*3/uL (ref 4.0–10.5)

## 2016-05-28 LAB — MRSA PCR SCREENING: MRSA BY PCR: POSITIVE — AB

## 2016-05-28 MED ORDER — MUPIROCIN 2 % EX OINT
1.0000 "application " | TOPICAL_OINTMENT | Freq: Two times a day (BID) | CUTANEOUS | Status: DC
  Administered 2016-05-28 (×2): 1 via NASAL
  Filled 2016-05-28 (×2): qty 22

## 2016-05-28 MED ORDER — CIPROFLOXACIN HCL 250 MG PO TABS: 250.0000 mg | ORAL_TABLET | Freq: Two times a day (BID) | ORAL | Status: AC

## 2016-05-28 MED ORDER — HALOPERIDOL 2 MG PO TABS: 1.0000 mg | ORAL_TABLET | ORAL | Status: DC | PRN

## 2016-05-28 MED ORDER — CHLORHEXIDINE GLUCONATE CLOTH 2 % EX PADS
6.0000 | MEDICATED_PAD | Freq: Every day | CUTANEOUS | Status: DC
  Administered 2016-05-28: 6 via TOPICAL

## 2016-05-28 NOTE — Care Management Obs Status (Signed)
MEDICARE OBSERVATION STATUS NOTIFICATION   Patient Details  Name: Richard Conley MRN: [id number] Date of Birth: [birth year]   Medicare Observation Status Notification Given:  Yes    Malcolm MetroChildress, Joshoa Shawler Demske, RN 05/28/2016, 9:33 AM

## 2016-05-28 NOTE — Care Management Note (Signed)
Case Management Note  Patient Details  Name: Burnett CorrenteBobby Hackbart MRN: [id number] Date of Birth: [birth year]  Subjective/Objective:                  Pt admitted observation with AMS. [facility] [place]. Pt lives there long term. Family anticipates return to facility today. CSW is aware and will make arrangements for return to facility.   Action/Plan: No CM needs anticipated.   Expected Discharge Date:  05/28/16               Expected Discharge Plan:  Skilled Nursing Facility  In-House Referral:  Clinical Social Work  Discharge planning Services  CM Consult  Post Acute Care Choice:  NA Choice offered to:  NA  DME Arranged:    DME Agency:     HH Arranged:    HH Agency:     Status of Service:  Completed, signed off  If discussed at [employer] Meetings, dates discussed:    Additional Comments:  Malcolm MetroChildress, Laquincy Eastridge Demske, RN 05/28/2016, 9:35 AM

## 2016-05-28 NOTE — Discharge Summary (Signed)
Physician Discharge Summary  Richard Conley ZHY:[id number] DOB: 1933-12-19 DOA: 05/27/2016  PCP: No primary care provider on file.  Admit date: 05/27/2016 Discharge date: 05/28/2016  Time spent: 45 minutes  Recommendations for Outpatient Follow-up:  -Will be discharged back to SNF today. -Continue cipro for 7 days.   Discharge Diagnoses:  Principal Problem:   Encephalopathy, metabolic Active Problems:   Urinary tract infection   Dementia with behavioral disturbance   Parkinson disease (HCC)   Pressure ulcer   Discharge Condition: Stable  Filed Weights   05/27/16 1439 05/27/16 2100  Weight: 74.844 kg (165 lb) 64.32 kg (141 lb 12.8 oz)    History of present illness:  As per Dr. Mariea Clonts on 6/21: Richard Conley is a 82 y.o. male, With past medical history relevant for dementia with behavioral disturbance, Parkinson's disease, hypertension who presents from skilled nursing facility with increased confusion, temperature of 100.8 and lethargy. History is very limited due to advanced dementia, in ED no abdominal or flank pain no vomiting or diarrhea, no leukocytosis no tachycardia or tachypnea. UA and urine microscopy strongly suggestive of UTI, patient's daughter is concerned about new right-sided weakness and possible stroke. Patient has old left-sided weakness from previous stroke  Hospital Course:   Acute Encephalopathy -At baseline. -Likely related to UTI. -Unable to perform MRI given h/o claustrophobia. Have discussed with daughter that I do not believe test is necessary given her has no focal deficits and the fact that even if the MRI were positive for CVA, I do not believe we would do anything based on that result on account of his age and co-morbidities.  UTI -Cipro for 7 days. -Urine cx is pending at time of DC.  Parkinson's Disease -At baseline. -Continue sinemet.  ARF -Resolved.  Procedures:  None   Consultations:  None  Discharge  Instructions  Discharge Instructions    Increase activity slowly    Complete by:  As directed             Medication List    TAKE these medications        acetaminophen 325 MG tablet  Commonly known as:  TYLENOL  Take 650 mg by mouth every 6 (six) hours.     albuterol-ipratropium 18-103 MCG/ACT inhaler  Commonly known as:  COMBIVENT  Inhale 1 puff into the lungs every 6 (six) hours as needed for wheezing or shortness of breath.     atenolol 25 MG tablet  Commonly known as:  TENORMIN  Take 12.5 mg by mouth daily.     budesonide-formoterol 160-4.5 MCG/ACT inhaler  Commonly known as:  SYMBICORT  Inhale 2 puffs into the lungs 2 (two) times daily.     carbidopa-levodopa-entacapone 50-200-200 MG tablet  Commonly known as:  STALEVO  Take 1 tablet by mouth 3 (three) times daily.     cholecalciferol 1000 units tablet  Commonly known as:  VITAMIN D  Take 1,000 Units by mouth daily.     ciprofloxacin 250 MG tablet  Commonly known as:  CIPRO  Take 1 tablet (250 mg total) by mouth 2 (two) times daily.     ferrous sulfate 325 (65 FE) MG tablet  Take 325 mg by mouth daily.     HALOPERIDOL LACTATE PO  Take 1 mg by mouth as needed (for behavior related to dementia).     lisinopril 5 MG tablet  Commonly known as:  PRINIVIL,ZESTRIL  Take 5 mg by mouth daily.     loperamide 2 MG tablet  Commonly known as:  IMODIUM A-D  Take 2 mg by mouth as needed for diarrhea or loose stools.     Melatonin 5 MG Tabs  Take 10 mg by mouth at bedtime.     mirtazapine 15 MG tablet  Commonly known as:  REMERON  Take 15 mg by mouth at bedtime.     omeprazole 20 MG capsule  Commonly known as:  PRILOSEC  Take 20 mg by mouth daily.     polyethylene glycol powder powder  Commonly known as:  GLYCOLAX/MIRALAX  Take 17 g by mouth daily.     sertraline 100 MG tablet  Commonly known as:  ZOLOFT  Take 100 mg by mouth daily.     traZODone 150 MG tablet  Commonly known as:  DESYREL  Take 150 mg  by mouth at bedtime.     vitamin B-12 1000 MCG tablet  Commonly known as:  CYANOCOBALAMIN  Take 1,000 mcg by mouth daily.       Allergies  Allergen Reactions  . Ativan [Lorazepam]   . Zyprexa [Olanzapine]       The results of significant diagnostics from this hospitalization (including imaging, microbiology, ancillary and laboratory) are listed below for reference.    Significant Diagnostic Studies: Dg Chest 2 View  05/27/2016  CLINICAL DATA:  Cough today.  Initial encounter. EXAM: CHEST  2 VIEW COMPARISON:  None. FINDINGS: Lung volumes are somewhat low but the lungs are clear. Heart size is normal. No pneumothorax or pleural effusion. No focal bony abnormality. Degenerative change about the shoulders noted. IMPRESSION: No acute disease. Electronically Signed   By: Drusilla Kanner M.D.   On: 05/27/2016 15:24   Ct Head Wo Contrast  05/27/2016  CLINICAL DATA:  Fever and left-sided weakness. Parkinson disease. Dementia. EXAM: CT HEAD WITHOUT CONTRAST TECHNIQUE: Contiguous axial images were obtained from the base of the skull through the vertex without intravenous contrast. COMPARISON:  None. FINDINGS: 2.1 by 0.7 cm focus of encephalomalacia involving the right caudate head, anterior limb right internal capsule, and periventricular white matter, images 19-22 series 2. Faint 3 mm hypodensity in the right thalamus, image 18/2. Likely a remote lacunar infarct. Otherwise, the brainstem, cerebellum, cerebral peduncles, thalami, basal ganglia, basilar cisterns, and ventricular system appear within normal limits. No intracranial hemorrhage, mass lesion, or acute CVA. Chronic bilateral maxillary and ethmoid sinusitis with a small amount of frothy material in the left sphenoid sinus. IMPRESSION: 1. Remote infarct of the right caudate head, anterior limb right internal capsule, and adjacent periventricular white matter. 2. Small remote lacunar infarct in the right thalamus. 3. No acute intracranial  findings. 4. Mild chronic paranasal sinusitis. Electronically Signed   By: Gaylyn Rong M.D.   On: 05/27/2016 15:44    Microbiology: Recent Results (from the past 240 hour(s))  Blood culture (routine x 2)     Status: None (Preliminary result)   Collection Time: 05/27/16  3:45 PM  Result Value Ref Range Status   Specimen Description BLOOD RIGHT ARM  Final   Special Requests   Final    BOTTLES DRAWN AEROBIC AND ANAEROBIC AEB=8CC ANA=5CC   Culture NO GROWTH < 24 HOURS  Final   Report Status PENDING  Incomplete  Blood culture (routine x 2)     Status: None (Preliminary result)   Collection Time: 05/27/16  3:57 PM  Result Value Ref Range Status   Specimen Description BLOOD LEFT ARM  Final   Special Requests   Final    BOTTLES DRAWN  AEROBIC AND ANAEROBIC AEB=7CC ANA=6CC   Culture NO GROWTH < 24 HOURS  Final   Report Status PENDING  Incomplete  MRSA PCR Screening     Status: Abnormal   Collection Time: 05/27/16 11:08 PM  Result Value Ref Range Status   MRSA by PCR POSITIVE (A) NEGATIVE Final    Comment:        The GeneXpert MRSA Assay (FDA approved for NASAL specimens only), is one component of a comprehensive MRSA colonization surveillance program. It is not intended to diagnose MRSA infection nor to guide or monitor treatment for MRSA infections. RESULT CALLED TO, READ BACK BY AND VERIFIED WITH: GAVIN B AT 0246 ON [id number] BY FORSYTH K      Labs: Basic Metabolic Panel:  Recent Labs Lab 05/27/16 1545 05/28/16 0542  NA 139 140  K 3.8 3.7  CL 107 110  CO2 27 26  GLUCOSE 92 86  BUN 28* 22*  CREATININE 0.93 0.94  CALCIUM 8.7* 8.2*   Liver Function Tests:  Recent Labs Lab 05/27/16 1545  AST 20  ALT 8*  ALKPHOS 51  BILITOT 0.5  PROT 6.5  ALBUMIN 3.5   No results for input(s): LIPASE, AMYLASE in the last 168 hours. No results for input(s): AMMONIA in the last 168 hours. CBC:  Recent Labs Lab 05/27/16 1545 05/28/16 0542  WBC 8.5 7.7  NEUTROABS 5.6   --   HGB 11.6* 11.8*  HCT 35.0* 35.2*  MCV 92.3 91.9  PLT 160 139*   Cardiac Enzymes:  Recent Labs Lab 05/27/16 1545  TROPONINI <0.03   BNP: BNP (last 3 results) No results for input(s): BNP in the last 8760 hours.  ProBNP (last 3 results) No results for input(s): PROBNP in the last 8760 hours.  CBG: No results for input(s): GLUCAP in the last 168 hours.     SignedChaya Jan:  HERNANDEZ ACOSTA,ESTELA  [facility] Pager: [phone] 05/28/2016, 12:27 PM

## 2016-05-28 NOTE — Progress Notes (Signed)
[facility], report called,and given to [facility] LPN. [facility].

## 2016-05-28 NOTE — Clinical Social Work Note (Signed)
Clinical Social Work Assessment  Patient Details  Name: Richard CorrenteBobby Conley MRN: [id number] Date of Birth: [birth year]  Date of referral:  05/28/16               Reason for consult:  Discharge Planning                Permission sought to share information with:    Permission granted to share information::     Name::        Agency::     Relationship::     Contact Information:     Housing/Transportation Living arrangements for the past 2 months:  Skilled Nursing Facility Source of Information:  Adult Children Patient Interpreter Needed:  None Criminal Activity/Legal Involvement Pertinent to Current Situation/Hospitalization:  No - Comment as needed Significant Relationships:  Adult Children Lives with:  Facility Resident Do you feel safe going back to the place where you live?  Yes Need for family participation in patient care:  Yes (Comment)  Care giving concerns:  None reported. [facility]    Social Worker assessment / plan:  CSW spoke with pt's daughter, Richard Conley on phone as pt is oriented to self only. Richard Conley indicates pt has been a resident at [facility] for about 8 months. She lives out of town and visits about weekly. Pt primarily uses a wheelchair. He is on dementia unit at SNF. Per Richard Conley at facility, pt is nursing level of care and okay to return. MRI today and if negative, will d/c.   Employment status:  Retired Health and safety inspectornsurance information:  Medicare PT Recommendations:  Not assessed at this time Information / Referral to community resources:  Other (Comment Required) ([facility]  Patient/Family's Response to care:  Pt's daughter agreeable to [facility] when medically stable.   Patient/Family's Understanding of and Emotional Response to Diagnosis, Current Treatment, and Prognosis:  Pt's daughter aware of admission to hospital and plan for MRI and if negative, to return to SNF today.   Emotional  Assessment Appearance:  Appears stated age Attitude/Demeanor/Rapport:  Unable to Assess Affect (typically observed):  Unable to Assess Orientation:  Oriented to Self Alcohol / Substance use:  Not Applicable Psych involvement (Current and /or in the community):  No (Comment)  Discharge Needs  Concerns to be addressed:  Discharge Planning Concerns Readmission within the last 30 days:  No Current discharge risk:  Cognitively Impaired Barriers to Discharge:  No Barriers Identified   Richard [facility], Richard Mccamish Shanaberger, LCSW 05/28/2016, 9:10 AM [phone]

## 2016-05-28 NOTE — Progress Notes (Signed)
RT attempted to instruct patient several times on usage of Dulera inhaler; however, patient is unable to follow commands and take a deep breath.

## 2016-05-28 NOTE — Clinical Social Work Note (Addendum)
Pt d/c today back to [facility] [place]. Facility aware and agreeable to no FL2 due to <24 hour observation. CSW called to notify pt's daughter who was very upset about several things in hospital. She states that she is on her way here and wants answers. Unit director notified and discussed with RN. [facility] after daughter's visit.   Derenda FennelKara Kaedyn Polivka, LCSW [phone]

## 2016-05-29 LAB — URINE CULTURE: Culture: <10000 — AB

## 2016-06-02 LAB — CULTURE, BLOOD (ROUTINE X 2)
Culture: NO GROWTH
Culture: NO GROWTH

## 2017-12-25 IMAGING — DX DG CHEST 2V
2 series · 2 of 2 positions shown · non-contrast
Comparison: None.

CLINICAL DATA: Cough today.  Initial encounter.

EXAM:
CHEST  2 VIEW

[chest lat]
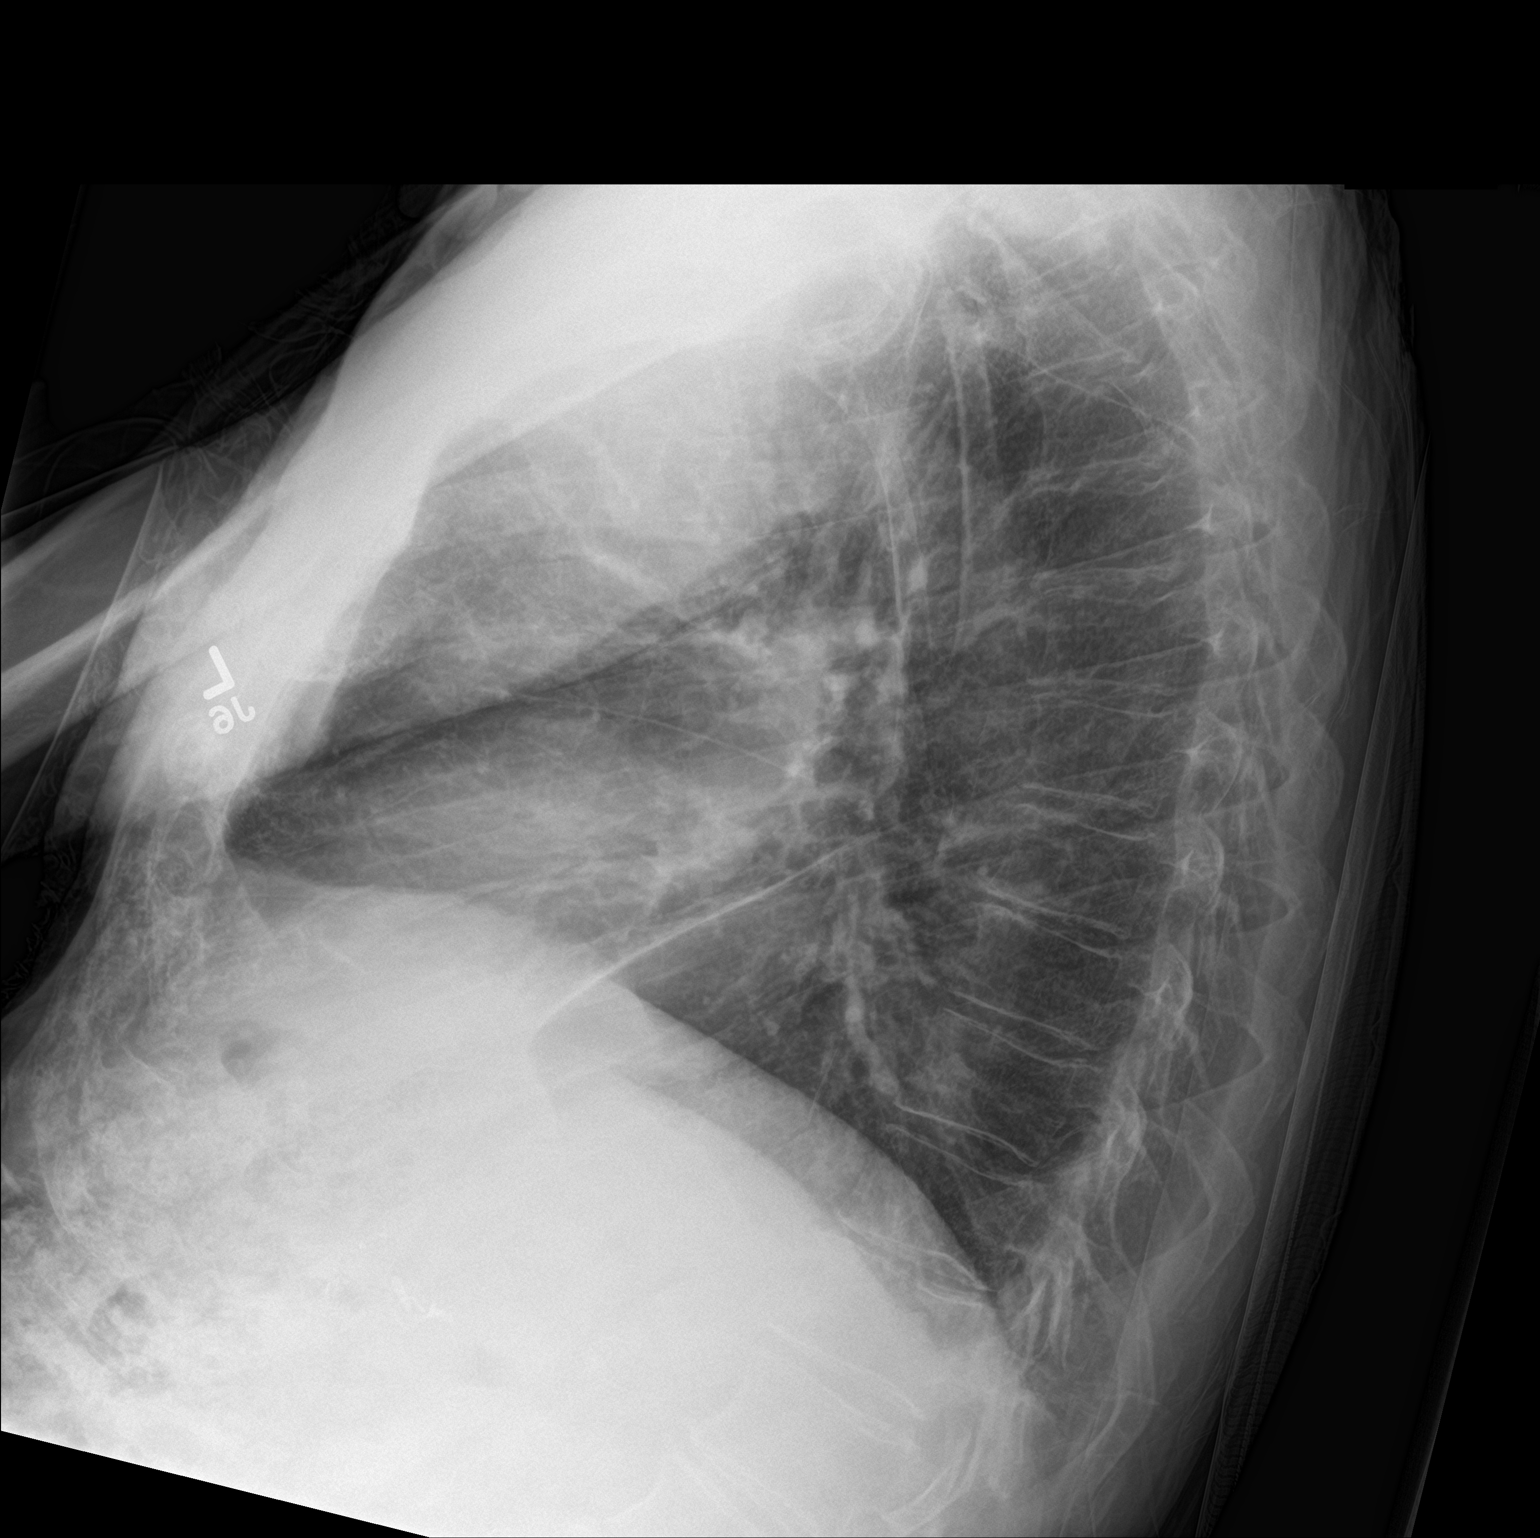

[chest ap strecther]
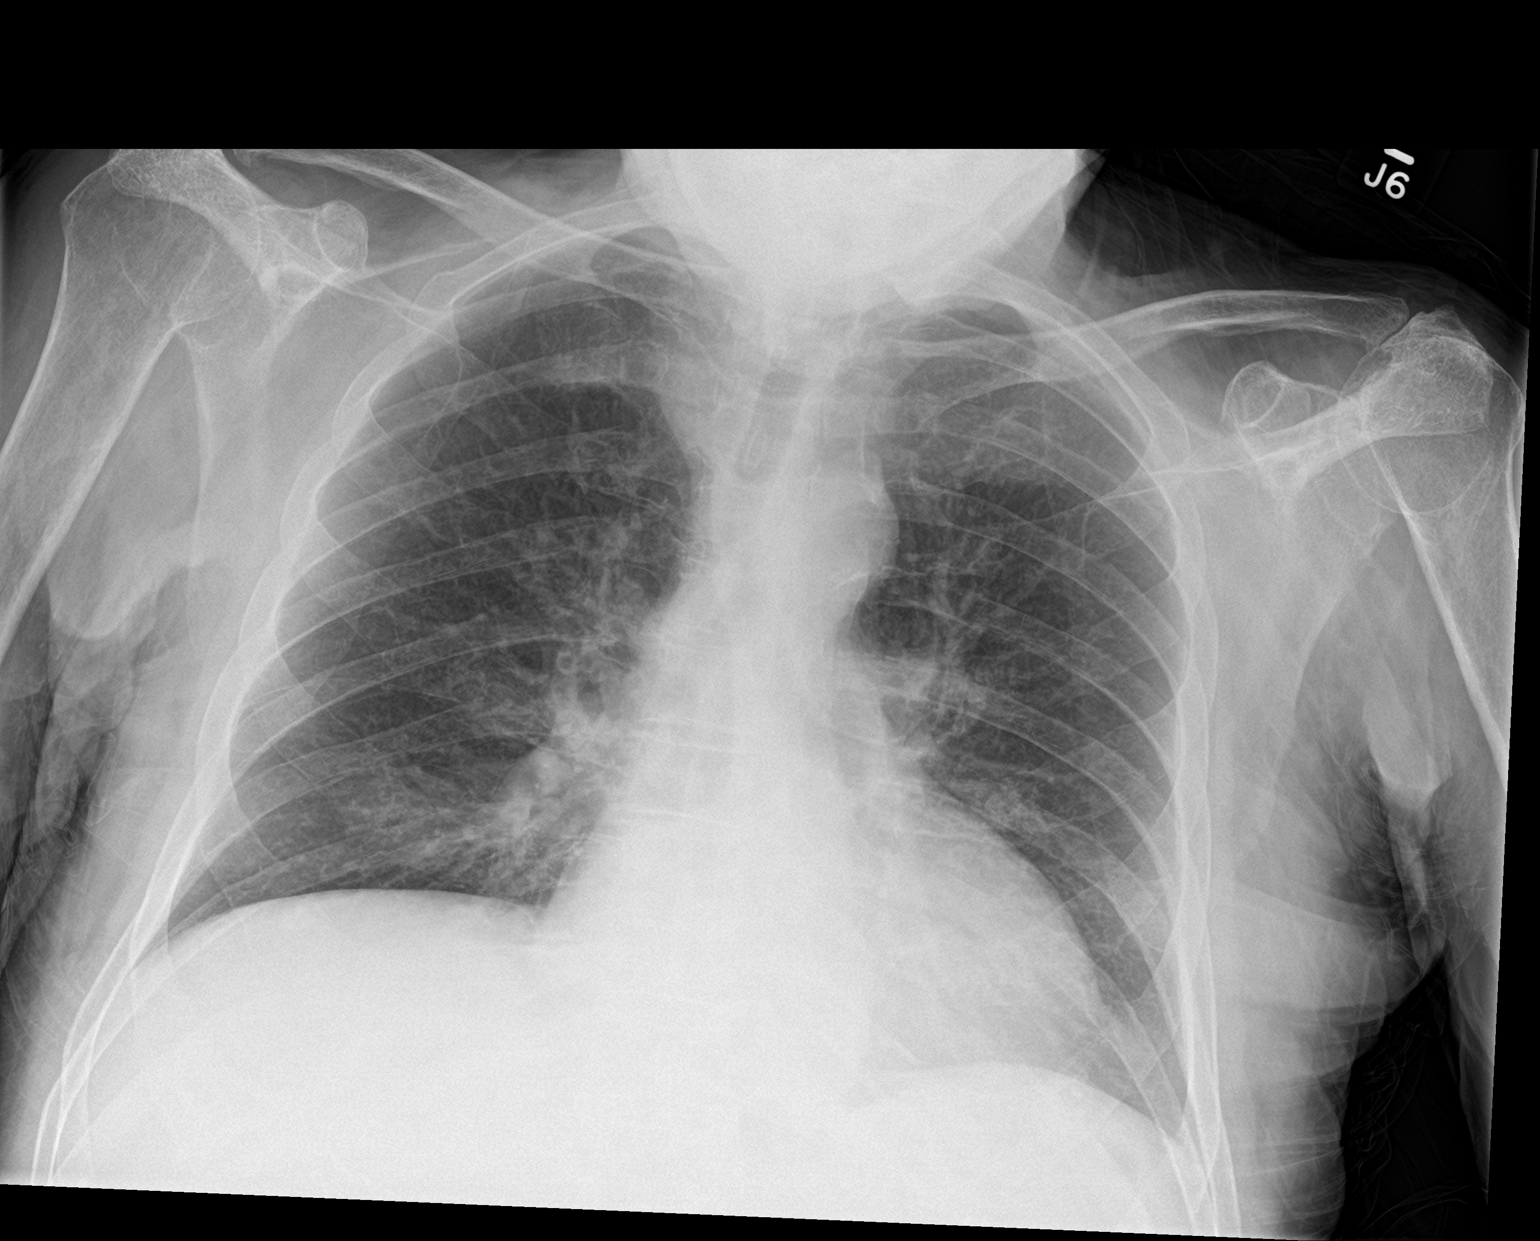

[2 of 2 positions shown; findings below may reference images not displayed]

FINDINGS: Lung volumes are somewhat low but the lungs are clear. Heart size is
normal. No pneumothorax or pleural effusion. No focal bony
abnormality. Degenerative change about the shoulders noted.
IMPRESSION: No acute disease.

## 2017-12-25 IMAGING — CT CT HEAD W/O CM
3 of 4 series · 15 of 47 positions shown, 18 images · non-contrast
Comparison: None.

CLINICAL DATA: Fever and left-sided weakness. Parkinson disease.
Dementia.

EXAM:
CT HEAD WITHOUT CONTRAST
TECHNIQUE: Contiguous axial images were obtained from the base of the skull
through the vertex without intravenous contrast.

[Series 2: head w/o · axial · non-contrast · 0.44mm/px · z∈[+78,+214]mm · 9 of 40 slices shown, 12 images]
[im 3/40  brain]
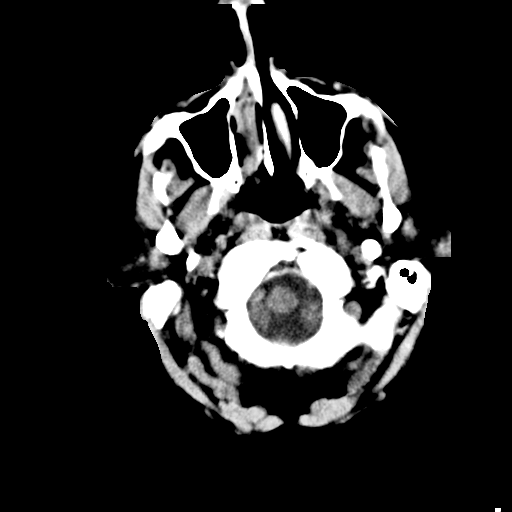
[im 3/40  bone]
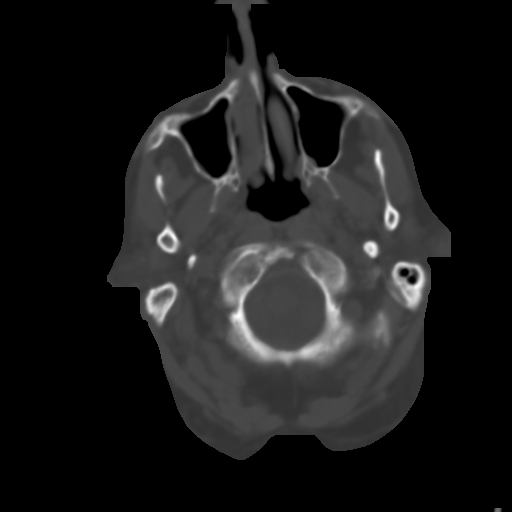
[im 9/40  brain]
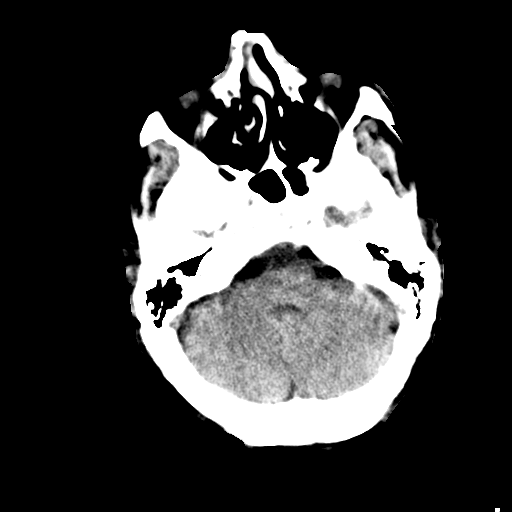
[im 12/40  brain]
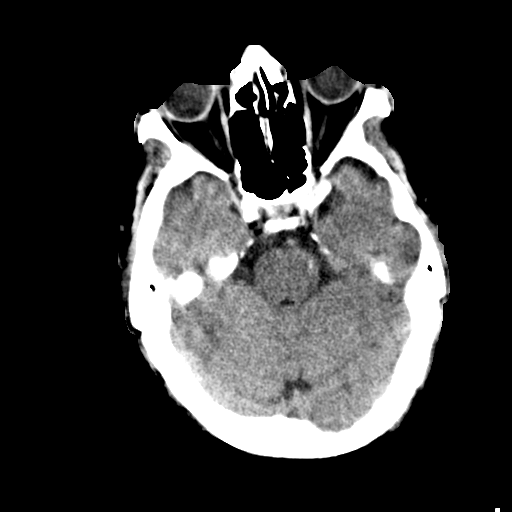
[im 17/40  brain]
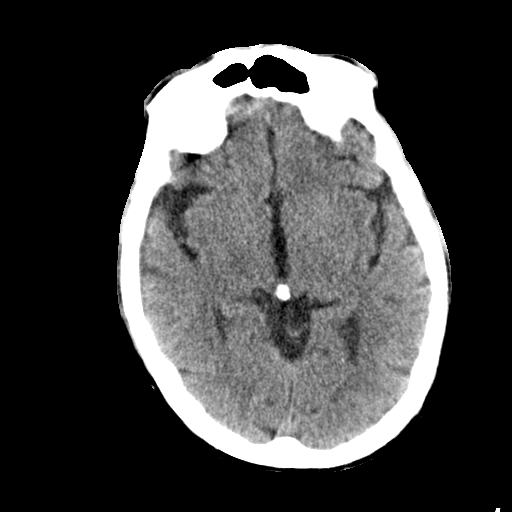
[im 20/40  brain]
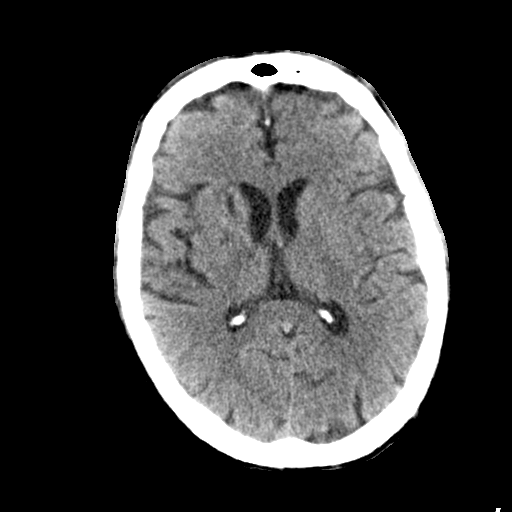
[im 20/40  bone]
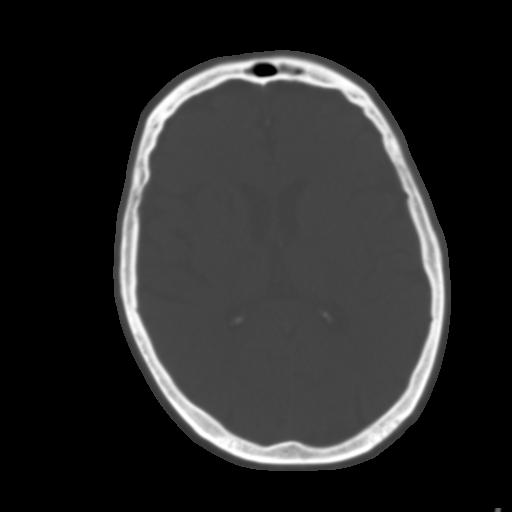
[im 23/40  brain]
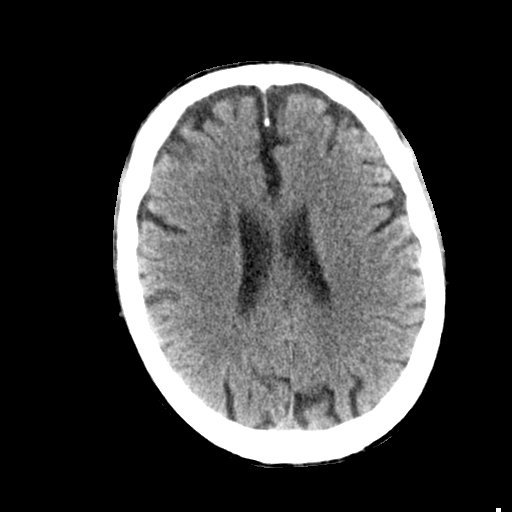
[im 28/40  brain]
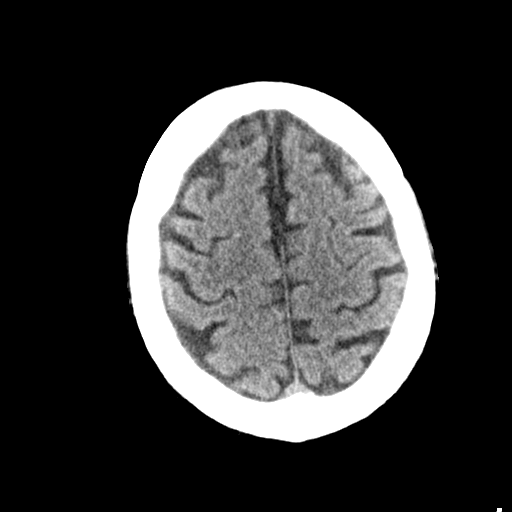
[im 31/40  brain]
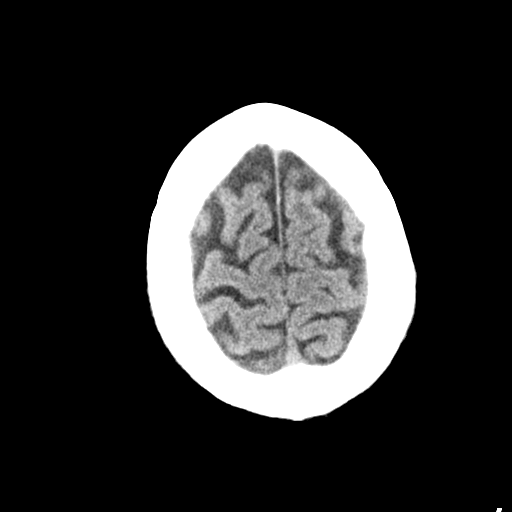
[im 37/40  brain]
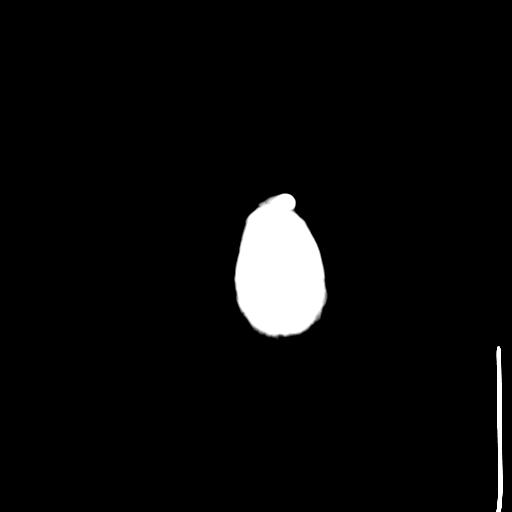
[im 37/40  bone]
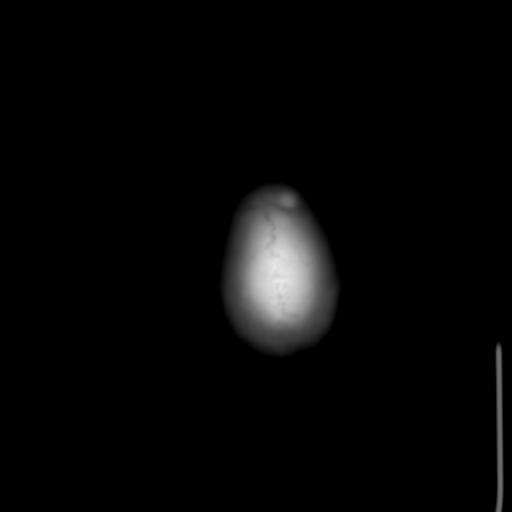

[Series 4: coronal · coronal · 0.31mm/px · 3 of 69 slices shown]
[im 23/69  brain]
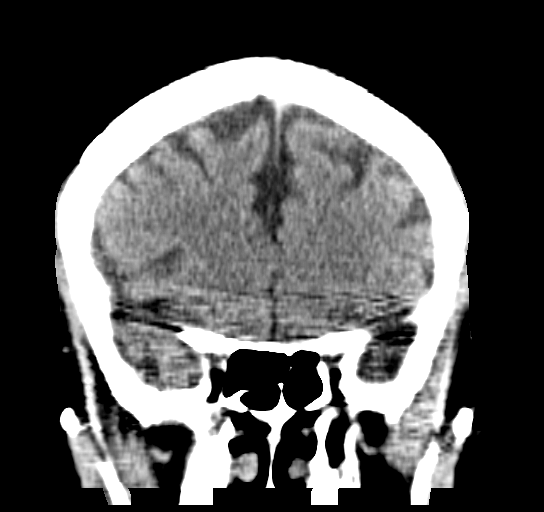
[im 31/69  brain]
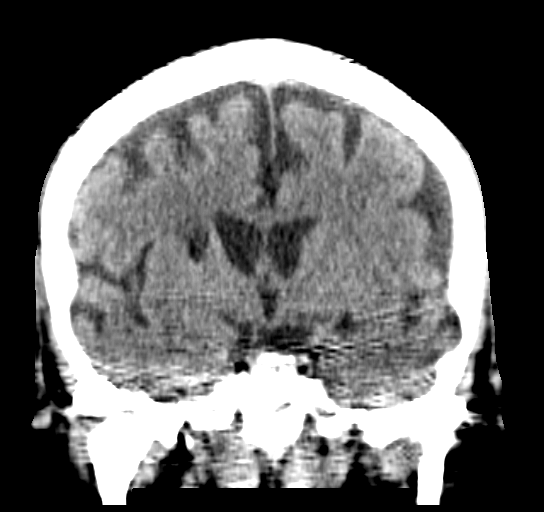
[im 38/69  brain]
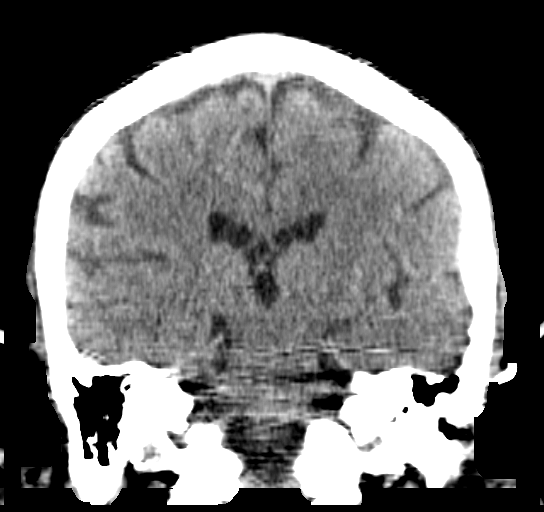

[Series 5: sagittal · sagittal · 0.32mm/px · 3 of 54 slices shown]
[im 18/54  brain]
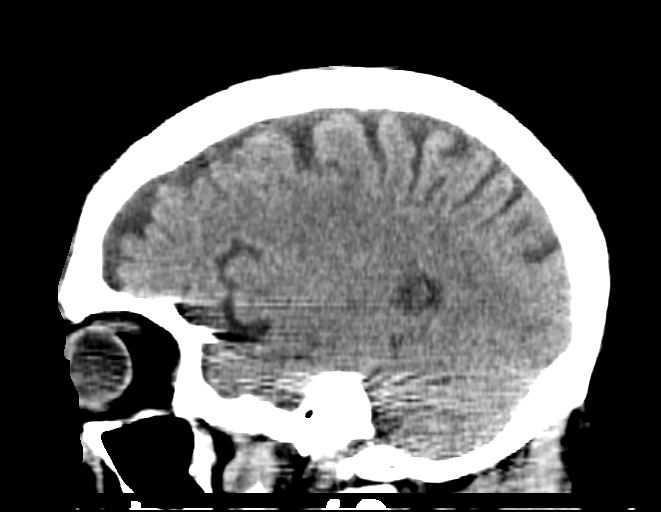
[im 27/54  brain]
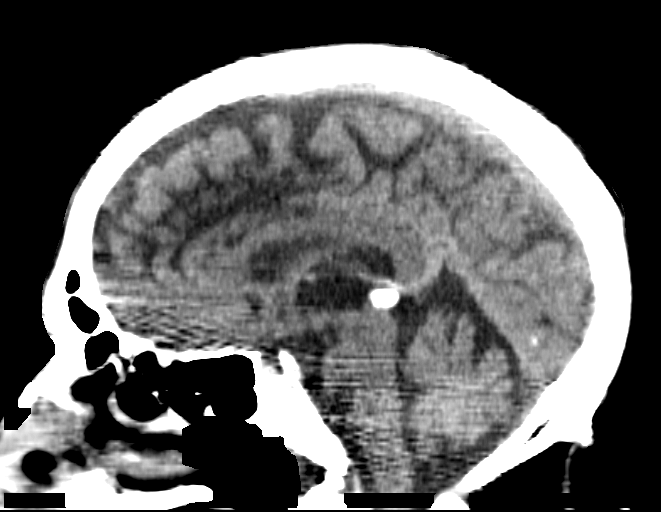
[im 36/54  brain]
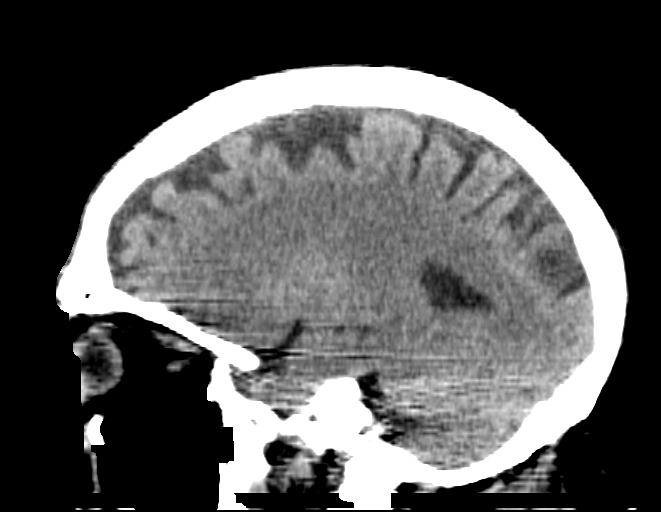

[15 of 47 positions shown; findings below may reference images not displayed]

FINDINGS: 2.1 by 0.7 cm focus of encephalomalacia involving the right caudate
head, anterior limb right internal capsule, and periventricular
white matter, images 19-22 series 2.

Faint 3 mm hypodensity in the right thalamus, image [DATE]. Likely a
remote lacunar infarct.

Otherwise, the brainstem, cerebellum, cerebral peduncles, thalami,
basal ganglia, basilar cisterns, and ventricular system appear
within normal limits. No intracranial hemorrhage, mass lesion, or
acute CVA. Chronic bilateral maxillary and ethmoid sinusitis with a
small amount of frothy material in the left sphenoid sinus.
IMPRESSION: 1. Remote infarct of the right caudate head, anterior limb right
internal capsule, and adjacent periventricular white matter.
2. Small remote lacunar infarct in the right thalamus.
3. No acute intracranial findings.
4. Mild chronic paranasal sinusitis.
